# Patient Record
Sex: Male | Born: 1965 | Hispanic: Yes | Marital: Married
Health system: Southern US, Community
[De-identification: ages and names within clinical notes are randomized; demographics above are authoritative.]

---

## 2018-12-05 ENCOUNTER — Other Ambulatory Visit: Payer: Self-pay

## 2018-12-05 ENCOUNTER — Emergency Department (HOSPITAL_BASED_OUTPATIENT_CLINIC_OR_DEPARTMENT_OTHER)
Admission: EM | Admit: 2018-12-05 | Discharge: 2018-12-05 | Disposition: A | Discharge status: Home / Self Care | Payer: Worker's Compensation | Attending: Emergency Medicine | Admitting: Emergency Medicine

## 2018-12-05 ENCOUNTER — Emergency Department (HOSPITAL_BASED_OUTPATIENT_CLINIC_OR_DEPARTMENT_OTHER): Payer: Worker's Compensation

## 2018-12-05 ENCOUNTER — Encounter (HOSPITAL_BASED_OUTPATIENT_CLINIC_OR_DEPARTMENT_OTHER): Payer: Self-pay

## 2018-12-05 DIAGNOSIS — Y9301 Activity, walking, marching and hiking: Secondary | ICD-10-CM | POA: Insufficient documentation

## 2018-12-05 DIAGNOSIS — S0003XA Contusion of scalp, initial encounter: Secondary | ICD-10-CM | POA: Diagnosis not present

## 2018-12-05 DIAGNOSIS — W01198A Fall on same level from slipping, tripping and stumbling with subsequent striking against other object, initial encounter: Secondary | ICD-10-CM | POA: Diagnosis not present

## 2018-12-05 DIAGNOSIS — S0990XA Unspecified injury of head, initial encounter: Secondary | ICD-10-CM | POA: Diagnosis present

## 2018-12-05 DIAGNOSIS — Y929 Unspecified place or not applicable: Secondary | ICD-10-CM | POA: Insufficient documentation

## 2018-12-05 DIAGNOSIS — Y990 Civilian activity done for income or pay: Secondary | ICD-10-CM | POA: Insufficient documentation

## 2018-12-05 NOTE — ED Notes (Signed)
Supervisor at bedside with patient.  Verifies UDS related to workers comp was completed at other facility prior to arrival here for CT scan.

## 2018-12-05 NOTE — ED Notes (Signed)
Pt denies any significant pmh, although has been told in past his b/p has been elevated.  No medications ordered for patient.  MD aware.

## 2018-12-05 NOTE — ED Triage Notes (Addendum)
Pt spanish speaking, translation device in use for evaluation.  While at work fell and hit head on some type of wood. Visible bruising swelling right side of head/right ear.  Denies vision changes.

## 2018-12-05 NOTE — Discharge Instructions (Signed)
Tylenol 1000 mg every 6 hours as needed for pain.  Follow-up with your primary doctor if symptoms not improving in the next few days.

## 2018-12-05 NOTE — ED Notes (Signed)
Visitor at bedside.

## 2018-12-05 NOTE — ED Provider Notes (Signed)
[facility] EMERGENCY DEPARTMENT Provider Note   CSN: [id number] Arrival date & time: 12/05/18  1025     History   Chief Complaint Chief Complaint  Patient presents with  . Head Injury    HPI Scott Hoover is a 53 y.o. male.     Patient is a 53 year old male with no significant past medical history.  He presents today for evaluation of a fall.  Patient was at work today carrying several objects when he tripped and fell forward striking his head on a block of wood.  He denies any loss of consciousness, but does have headache and significant swelling to the right parietal region.  He denies any neck pain.  He denies any weakness, numbness, or tingling.  The history is provided by the patient. The history is limited by a language barrier.  Head Injury Location:  R parietal Time since incident:  1 hour Mechanism of injury: fall   Fall:    Fall occurred:  [employer]   Point of impact:  Head   Entrapped after fall: no   Pain details:    Quality:  Throbbing   Severity:  Moderate   Timing:  Constant   Progression:  Unchanged Chronicity:  New   History reviewed. No pertinent past medical history.  There are no active problems to display for this patient.       Home Medications    Prior to Admission medications   Not on File    Family History History reviewed. No pertinent family history.  Social History Social History   Tobacco Use  . Smoking status: Not on file  Substance Use Topics  . Alcohol use: Not on file  . Drug use: Not on file     Allergies   Patient has no known allergies.   Review of Systems Review of Systems  All other systems reviewed and are negative.    Physical Exam Updated Vital Signs BP (!) 189/100 (BP Location: Right Arm)   Pulse 62   Temp 98.2 F (36.8 C) (Oral)   Resp 16   Ht 5\' 5"  (1.651 m)   Wt 79.4 kg   SpO2 100%   BMI 29.12 kg/m   Physical Exam Vitals signs and nursing note reviewed.  Constitutional:       General: He is not in acute distress.    Appearance: He is well-developed. He is not diaphoretic.  HENT:     Head: Normocephalic.     Comments: There is an ecchymotic, swollen area to the right parietal region.  There is no palpable defect.  There is also some swelling to the upper external ear, but no laceration or bleeding. Eyes:     Extraocular Movements: Extraocular movements intact.     Pupils: Pupils are equal, round, and reactive to light.  Neck:     Musculoskeletal: Normal range of motion and neck supple.  Cardiovascular:     Rate and Rhythm: Normal rate and regular rhythm.     Heart sounds: No murmur. No friction rub.  Pulmonary:     Effort: Pulmonary effort is normal. No respiratory distress.     Breath sounds: Normal breath sounds. No wheezing or rales.  Abdominal:     General: Bowel sounds are normal. There is no distension.     Palpations: Abdomen is soft.     Tenderness: There is no abdominal tenderness.  Musculoskeletal: Normal range of motion.  Skin:    General: Skin is warm and dry.  Neurological:  General: No focal deficit present.     Mental Status: He is alert and oriented to person, place, and time.     Cranial Nerves: No cranial nerve deficit.     Sensory: No sensory deficit.     Motor: No weakness.     Coordination: Coordination normal.      ED Treatments / Results  Labs (all labs ordered are listed, but only abnormal results are displayed) Labs Reviewed - No data to display  EKG None  Radiology No results found.  Procedures Procedures (including critical care time)  Medications Ordered in ED Medications - No data to display   Initial Impression / Assessment and Plan / ED Course  I have reviewed the triage vital signs and the nursing notes.  Pertinent labs & imaging results that were available during my care of the patient were reviewed by me and considered in my medical decision making (see chart for details).  Patient  neurologically intact and head CT is negative.  Will be discharged with ibuprofen, rest, and follow-up as needed.  Final Clinical Impressions(s) / ED Diagnoses   Final diagnoses:  None    ED Discharge Orders    None       Veryl Speak, MD 12/05/18 1155

## 2021-03-27 IMAGING — CT CT HEAD WITHOUT CONTRAST
3 series · 15 of 47 positions shown, 18 images · non-contrast
Comparison: None.

CLINICAL DATA: Posttraumatic headache; fall

EXAM:
CT HEAD WITHOUT CONTRAST
TECHNIQUE: Contiguous axial images were obtained from the base of the skull
through the vertex without intravenous contrast.

[Series 2: head wo · axial · 0.39mm/px · z∈[-165,-40]mm · 9 of 31 slices shown, 12 images]
[im 3/31  brain]
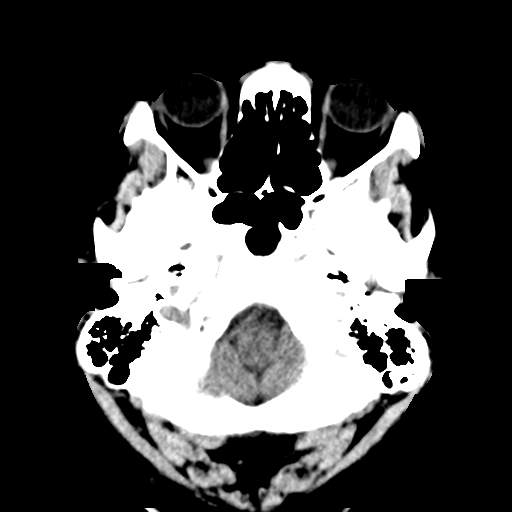
[im 3/31  bone]
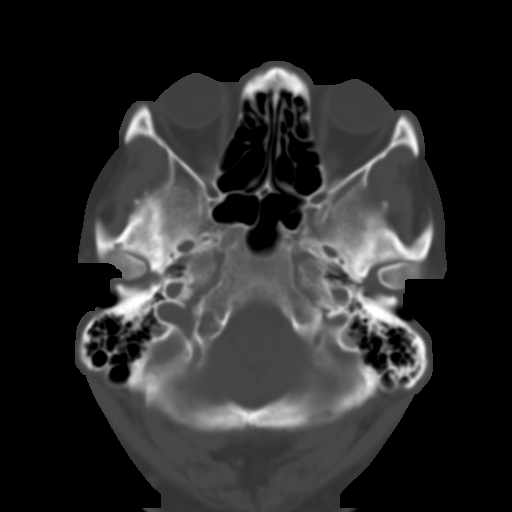
[im 6/31  brain]
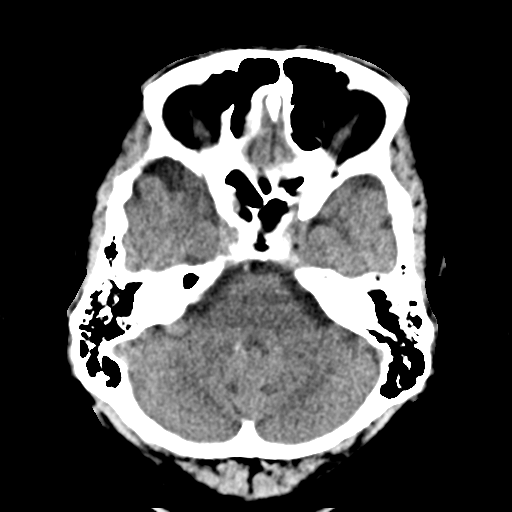
[im 9/31  brain]
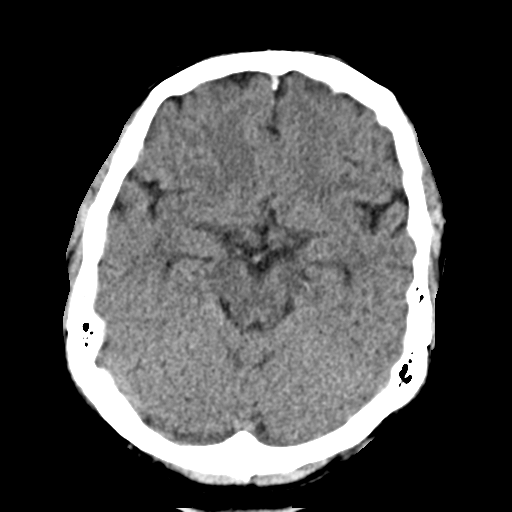
[im 12/31  brain]
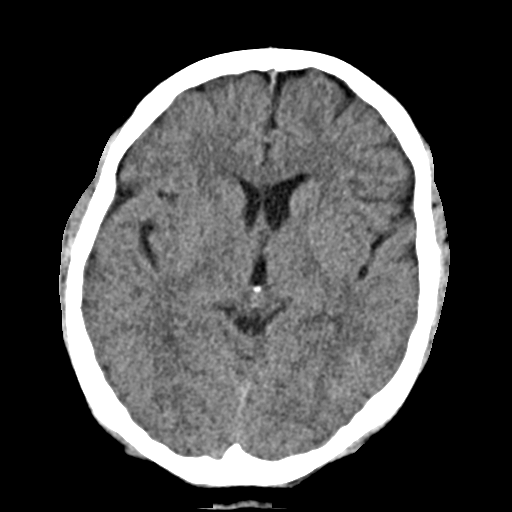
[im 16/31  brain]
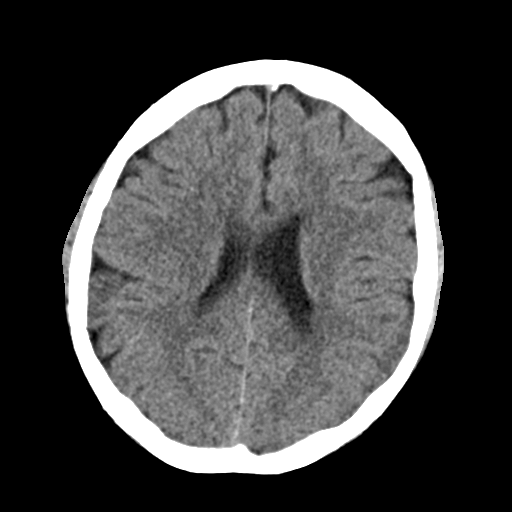
[im 16/31  bone]
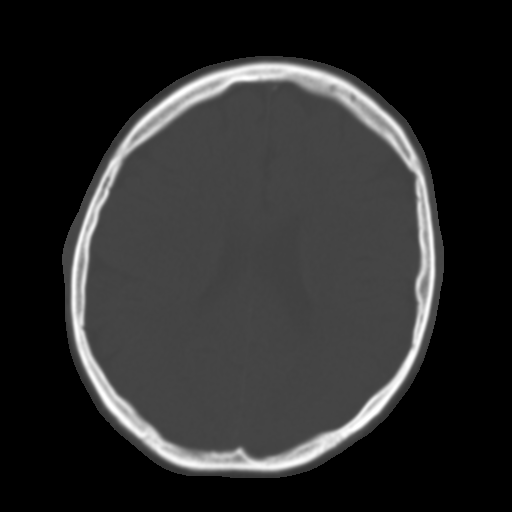
[im 19/31  brain]
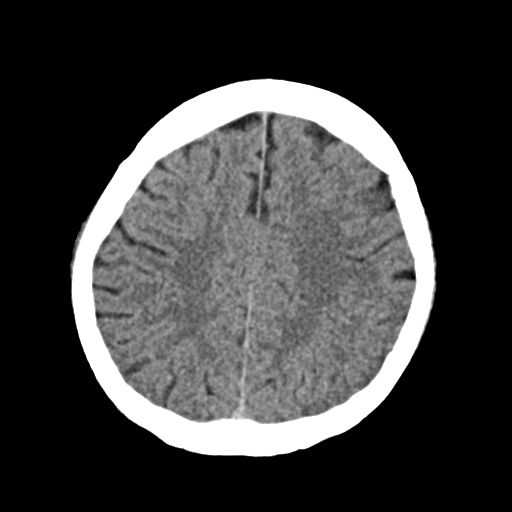
[im 22/31  brain]
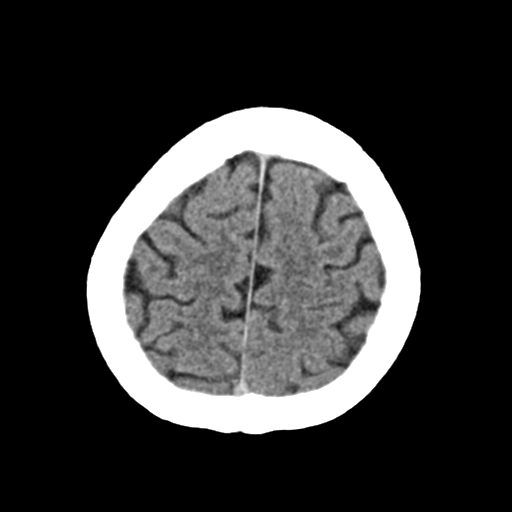
[im 25/31  brain]
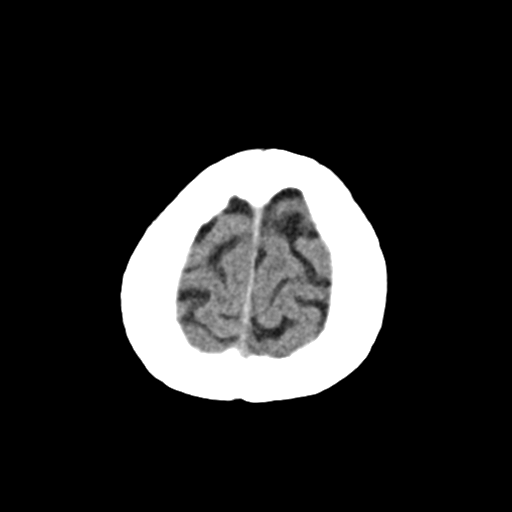
[im 28/31  brain]
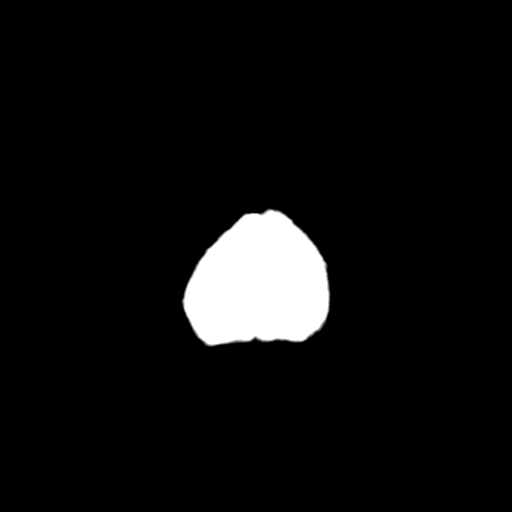
[im 28/31  bone]
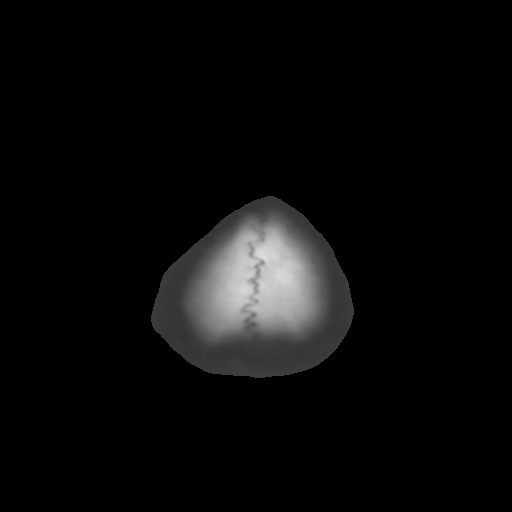

[Series 4: coronal soft · coronal · 0.32mm/px · 3 of 66 slices shown]
[im 22/66  brain]
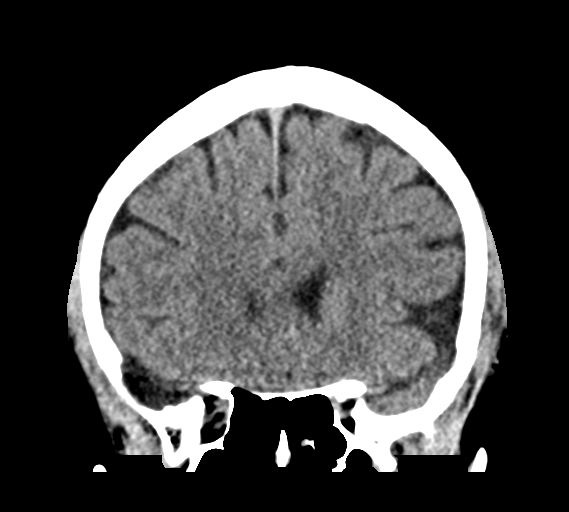
[im 29/66  brain]
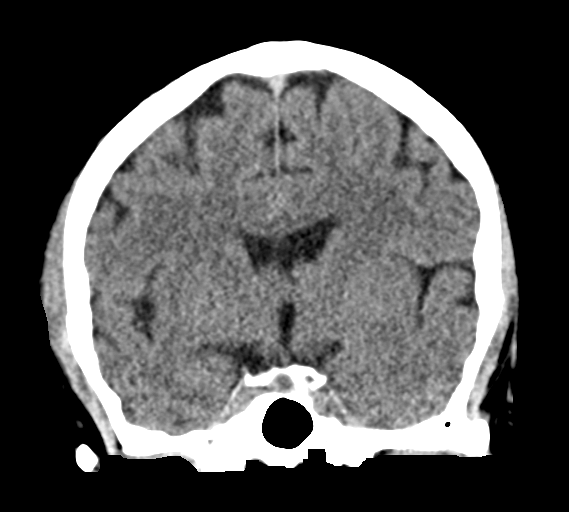
[im 37/66  brain]
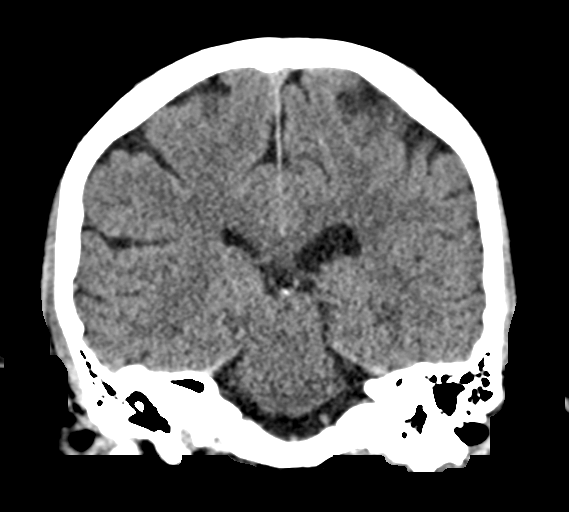

[Series 5: sag soft · sagittal · 0.31mm/px · 3 of 58 slices shown]
[im 20/58  brain]
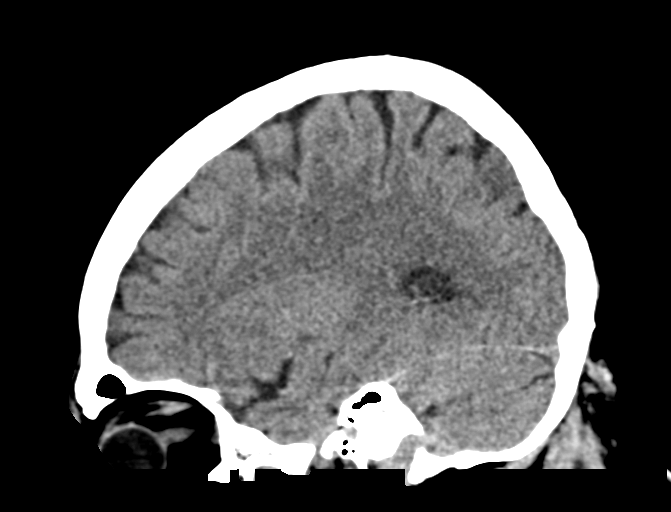
[im 29/58  brain]
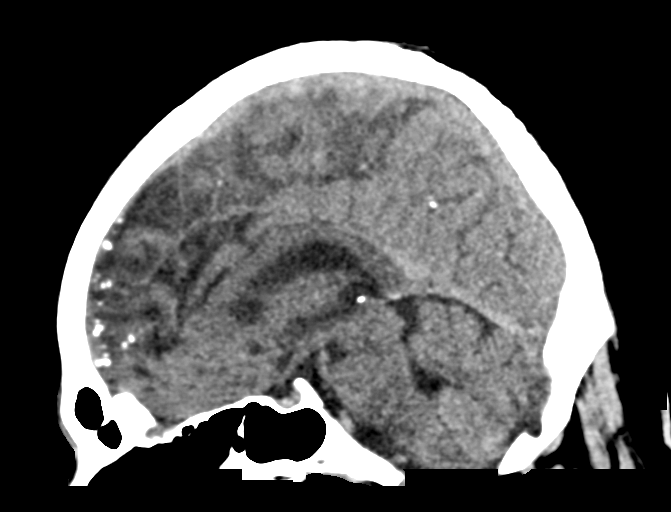
[im 39/58  brain]
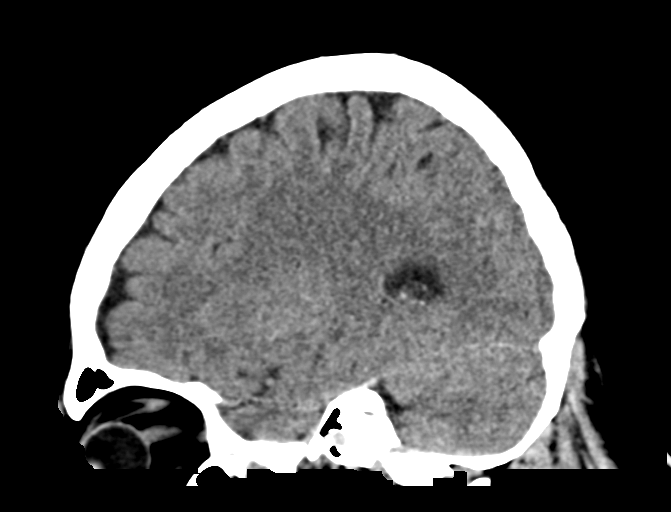

[15 of 47 positions shown; findings below may reference images not displayed]

FINDINGS: Brain: Ventricles are normal in size and configuration. There is no
intracranial mass, hemorrhage extra-axial fluid collection, or
midline shift. The brain parenchyma appears unremarkable. No evident
acute infarct.

Vascular: No hyperdense vessel. No appreciable vascular
calcification.

Skull: The bony calvarium appears intact.

Sinuses/Orbits: There is mucosal thickening in several ethmoid air
cells. Other visualized paranasal sinuses are clear. Visualized
orbits appear symmetric bilaterally.

Other: Mastoid air cells are clear.
IMPRESSION: Brain parenchyma appears unremarkable.  No mass or hemorrhage.

Mucosal thickening noted in several ethmoid air cells.
